# Patient Record
Sex: Male | Born: 1980 | Race: Black or African American | Hispanic: No | Marital: Single | Smoking: Former smoker
Health system: Southern US, Community
[De-identification: ages and names within clinical notes are randomized; demographics above are authoritative.]

---

## 2004-05-25 ENCOUNTER — Emergency Department (HOSPITAL_COMMUNITY): Admission: EM | Admit: 2004-05-25 | Discharge: 2004-05-25 | Payer: Self-pay | Admitting: Emergency Medicine

## 2004-05-25 ENCOUNTER — Ambulatory Visit (HOSPITAL_COMMUNITY): Admission: RE | Admit: 2004-05-25 | Discharge: 2004-05-25 | Payer: Self-pay | Admitting: Emergency Medicine

## 2008-09-11 HISTORY — PX: SHOULDER SURGERY: SHX246

## 2012-01-24 ENCOUNTER — Emergency Department (HOSPITAL_COMMUNITY)
Admission: EM | Admit: 2012-01-24 | Discharge: 2012-01-24 | Disposition: A | Discharge status: Home / Self Care | Payer: Self-pay | Source: Home / Self Care

## 2015-06-17 ENCOUNTER — Ambulatory Visit (INDEPENDENT_AMBULATORY_CARE_PROVIDER_SITE_OTHER): Payer: BLUE CROSS/BLUE SHIELD | Admitting: Family Medicine

## 2015-06-17 VITALS — BP 128/80 | HR 56 | Temp 98.4°F | Resp 16 | Ht 69.25 in | Wt 149.0 lb

## 2015-06-17 DIAGNOSIS — Z0000 Encounter for general adult medical examination without abnormal findings: Secondary | ICD-10-CM | POA: Diagnosis not present

## 2015-06-17 NOTE — Progress Notes (Signed)
[facility] [address], [place] [address] [phone]  Date:  06/17/2015   Name:  Jose Brennan   DOB:  1981/05/21   MRN:  [id number]  PCP:  No primary care provider on file.    Chief Complaint: Annual Exam   History of Present Illness:  Jose Brennan is a 34 y.o. 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   History reviewed. No pertinent past medical history.  Past Surgical History  Procedure Laterality Date  . Shoulder surgery Left 2010    Social History  Substance Use Topics  . Smoking status: Former Smoker    Quit date: 09/11/2006  . Smokeless tobacco: None  . Alcohol Use: 1.8 oz/week    3 Standard drinks or equivalent per week    Family History  Problem Relation Age of Onset  . Diabetes Maternal Grandmother     No Known Allergies  Medication list has been reviewed and updated.  No current outpatient prescriptions on file prior to visit.   No current facility-administered medications on file prior to visit.    Review of Systems:  As per HPI- otherwise negative.   Physical Examination: Filed Vitals:   06/17/15 1049  BP: 128/80  Pulse: 56  Temp: 98.4 F (36.9 C)  Resp: 16   Filed Vitals:   06/17/15 1049  Height: 5' 9.25" (1.759 m)  Weight: 149 lb (67.586 kg)   Body mass index is 21.84 kg/(m^2). Ideal Body Weight: Weight in (lb) to have BMI = 25: 170.2  GEN: WDWN, NAD, Non-toxic, A & O x 3, looks well HEENT: Atraumatic, Normocephalic. Neck supple. No masses, No  LAD. Ears and Nose: No external deformity. CV: RRR, No M/G/R. No JVD. No thrill. No extra heart sounds. PULM: CTA B, no wheezes, crackles, rhonchi. No retractions. No resp. distress. No accessory muscle use. ABD: S, NT, ND, +BS. No rebound. No HSM. EXTR: No c/c/e NEURO Normal gait.   Normal strength and ROM of all extremities PSYCH: Normally interactive. Conversant. Not depressed or anxious appearing.  Calm demeanor.  Normal GU exam  Assessment and Plan: Physical exam  Cleared for agility test Declines other services today  Signed Abbe Amsterdam, MD

## 2015-06-17 NOTE — Patient Instructions (Signed)
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

## 2015-08-25 ENCOUNTER — Other Ambulatory Visit: Payer: Self-pay | Admitting: Occupational Medicine

## 2015-08-25 ENCOUNTER — Ambulatory Visit
Admission: RE | Admit: 2015-08-25 | Discharge: 2015-08-25 | Disposition: A | Discharge status: Home / Self Care | Payer: No Typology Code available for payment source | Source: Ambulatory Visit | Attending: Occupational Medicine | Admitting: Occupational Medicine

## 2015-08-25 DIAGNOSIS — Z021 Encounter for pre-employment examination: Secondary | ICD-10-CM

## 2019-03-29 ENCOUNTER — Other Ambulatory Visit: Payer: Self-pay

## 2019-03-29 ENCOUNTER — Emergency Department (HOSPITAL_BASED_OUTPATIENT_CLINIC_OR_DEPARTMENT_OTHER)
Admission: EM | Admit: 2019-03-29 | Discharge: 2019-03-29 | Disposition: A | Discharge status: Home / Self Care | Payer: No Typology Code available for payment source | Attending: Emergency Medicine | Admitting: Emergency Medicine

## 2019-03-29 ENCOUNTER — Emergency Department (HOSPITAL_BASED_OUTPATIENT_CLINIC_OR_DEPARTMENT_OTHER): Payer: No Typology Code available for payment source

## 2019-03-29 ENCOUNTER — Encounter (HOSPITAL_BASED_OUTPATIENT_CLINIC_OR_DEPARTMENT_OTHER): Payer: Self-pay | Admitting: Adult Health

## 2019-03-29 DIAGNOSIS — Y929 Unspecified place or not applicable: Secondary | ICD-10-CM | POA: Insufficient documentation

## 2019-03-29 DIAGNOSIS — Z87891 Personal history of nicotine dependence: Secondary | ICD-10-CM | POA: Insufficient documentation

## 2019-03-29 DIAGNOSIS — Y9389 Activity, other specified: Secondary | ICD-10-CM | POA: Diagnosis not present

## 2019-03-29 DIAGNOSIS — Y999 Unspecified external cause status: Secondary | ICD-10-CM | POA: Diagnosis not present

## 2019-03-29 DIAGNOSIS — M79601 Pain in right arm: Secondary | ICD-10-CM | POA: Diagnosis present

## 2019-03-29 DIAGNOSIS — M5412 Radiculopathy, cervical region: Secondary | ICD-10-CM

## 2019-03-29 MED ORDER — IBUPROFEN 800 MG PO TABS
800.0000 mg | ORAL_TABLET | Freq: Once | ORAL | Status: AC
  Administered 2019-03-29: 800 mg via ORAL
  Filled 2019-03-29: qty 1

## 2019-03-29 MED ORDER — CYCLOBENZAPRINE HCL 10 MG PO TABS
10.0000 mg | ORAL_TABLET | Freq: Two times a day (BID) | ORAL | 0 refills | Status: AC | PRN
Start: 2019-03-29 — End: ?

## 2019-03-29 MED ORDER — PREDNISONE 10 MG PO TABS: 50.0000 mg | ORAL_TABLET | Freq: Every day | ORAL | 0 refills | Status: AC

## 2019-03-29 NOTE — ED Notes (Signed)
ED Provider at bedside. 

## 2019-03-29 NOTE — ED Triage Notes (Signed)
Present with a sharp tingling down right arm from the back of the shoulder blade to his thumb that began after he grabbed a hammer and was falling back wards, it pulled the right arm and shoulder. HE is here with [employer]

## 2019-03-29 NOTE — ED Provider Notes (Signed)
[facility] EMERGENCY DEPARTMENT Provider Note   CSN: [id number] Arrival date & time: 03/29/19  [id number]    History   Chief Complaint Chief Complaint  Patient presents with  . Arm Injury    HPI Italyhad Dutter is a 38 y.o. male.     38 year old male, right-hand-dominant, presents with complaint of sharp shooting pain in his right arm after a fall today.  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 Patient reports sharp shooting pain going to his right thumb at times, reproducible with turning his head to the right (right ear to right shoulder). Patient has not taken anything for pain prior to arrival. No other injuries or concerns.      History reviewed. No pertinent past medical history.  There are no active problems to display for this patient.   Past Surgical History:  Procedure Laterality Date  . SHOULDER SURGERY Left 2010        Home Medications    Prior to Admission medications   Medication Sig Start Date End Date Taking? Authorizing Provider  cyclobenzaprine (FLEXERIL) 10 MG tablet Take 1 tablet (10 mg total) by mouth 2 (two) times daily as needed for muscle spasms. 03/29/19   Jeannie FendMurphy, Laura A, PA-C  predniSONE (DELTASONE) 10 MG tablet Take 5 tablets (50 mg total) by mouth daily for 5 days. 03/29/19 04/03/19  Jeannie FendMurphy, Laura A, PA-C    Family History Family History  Problem Relation Age of Onset  . Diabetes Maternal Grandmother     Social History Social History   Tobacco Use  . Smoking status: Former Smoker    Quit date: 09/11/2006    Years since quitting: 12.5  Substance Use Topics  . Alcohol use: Yes    Alcohol/week: 3.0 standard drinks    Types: 3 Standard drinks or equivalent per week  . Drug use: No     Allergies   Patient has no known allergies.   Review of Systems Review of Systems  Constitutional: Negative for fever.   Musculoskeletal: Positive for myalgias. Negative for arthralgias, back pain, gait problem, joint swelling, neck pain and neck stiffness.  Skin: Negative for rash and wound.  Allergic/Immunologic: Negative for immunocompromised state.  Neurological: Negative for weakness and numbness.  Psychiatric/Behavioral: Negative for confusion.  All other systems reviewed and are negative.    Physical Exam Updated Vital Signs BP (!) 169/80   Pulse 67   Temp 98.3 F (36.8 C) (Oral)   Resp 18   Ht 5\' 9"  (1.753 m)   Wt 79.4 kg   SpO2 98%   BMI 25.84 kg/m   Physical Exam Vitals signs and nursing note reviewed.  Constitutional:      General: He is not in acute distress.    Appearance: He is well-developed. He is not diaphoretic.  HENT:     Head: Normocephalic and atraumatic.  Neck:     Musculoskeletal: Normal range of motion and neck supple. No muscular tenderness.  Cardiovascular:     Pulses: Normal pulses.  Pulmonary:     Effort: Pulmonary effort is normal.  Musculoskeletal: Normal range of motion.        General: Tenderness present. No swelling or deformity.     Right shoulder: He exhibits normal range of motion, no tenderness, no bony tenderness, no swelling, no effusion, no crepitus, normal pulse and normal strength.  Cervical back: He exhibits normal range of motion, no tenderness and no bony tenderness.     Thoracic back: He exhibits tenderness. He exhibits normal range of motion and no bony tenderness.       Back:     Comments: TTP right trapezius area without palpable spasm.  Skin:    General: Skin is warm and dry.  Neurological:     Mental Status: He is alert and oriented to person, place, and time.     Sensory: No sensory deficit.  Psychiatric:        Behavior: Behavior normal.      ED Treatments / Results  Labs (all labs ordered are listed, but only abnormal results are displayed) Labs Reviewed - No data to display  EKG None  Radiology Dg Cervical Spine  Complete  Result Date: 03/29/2019 CLINICAL DATA:  Radicular pain right arm. EXAM: CERVICAL SPINE - COMPLETE 4+ VIEW COMPARISON:  None. FINDINGS: Cervical spine alignment is maintained. Vertebral body heights and intervertebral disc spaces are preserved. The dens is intact. Posterior elements appear well-aligned. There is no evidence of fracture. Small right cervical rib. No prevertebral soft tissue edema. IMPRESSION: 1. No acute osseous abnormality of the cervical spine. 2. Incidental small right cervical rib. Electronically Signed   By: Narda RutherfordMelanie  Sanford M.D.   On: 03/29/2019 20:34    Procedures Procedures (including critical care time)  Medications Ordered in ED Medications  ibuprofen (ADVIL) tablet 800 mg (800 mg Oral Given 03/29/19 1959)     Initial Impression / Assessment and Plan / ED Course  I have reviewed the triage vital signs and the nursing notes.  Pertinent labs & imaging results that were available during my care of the patient were reviewed by me and considered in my medical decision making (see chart for details).  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  On exam patient is tenderness in the right trapezius area, pain is worse with right ear to shoulder movement of the neck.  There is no pain with range of motion of the right shoulder, no bony tenderness in the neck or shoulder.  X-ray of the C-spine is unremarkable with the exception of an incidental finding of a small right cervical rib.  Patient was given copy of his x-ray, discussed results.  Patient feels at this time like he is able to go back to work.  Patient will be discharged on prednisone, given prescription for Flexeril and advised not to take the Flexeril  if he is at work.  Plan is for patient to follow-up with employee health on Monday.   [LM]    Clinical Course User Index [LM] Jeannie FendMurphy, Laura A, PA-C       Final Clinical Impressions(s) / ED Diagnoses   Final diagnoses:  Cervical radiculopathy    ED Discharge Orders         Ordered    predniSONE (DELTASONE) 10 MG tablet  Daily     03/29/19 2054    cyclobenzaprine (FLEXERIL) 10 MG tablet  2 times daily PRN     03/29/19 2054           Jeannie FendMurphy, Laura A, PA-C 03/29/19 2056  Gareth Morgan, MD 03/30/19 1526

## 2019-03-29 NOTE — Discharge Instructions (Addendum)
Follow-up with employee health on Monday for recheck. You may return to work today if you feel safe and able to perform your job duties.  If you are unable to perform your job duties safely, you should go home and follow-up on Monday as planned with employee health.  Take prednisone as prescribed and complete the full course.  Take Flexeril as needed as prescribed for muscle spasm or soreness, do not drive or operate machinery if you are taking Flexeril.

## 2019-12-04 ENCOUNTER — Ambulatory Visit: Payer: 59 | Attending: Internal Medicine

## 2019-12-04 DIAGNOSIS — Z23 Encounter for immunization: Secondary | ICD-10-CM

## 2019-12-04 NOTE — Progress Notes (Signed)
   Covid-19 Vaccination Clinic  Name:  Jose Brennan    MRN: [id number] DOB: [birth year]  12/04/2019  Jose Brennan was observed post Covid-19 immunization for 15 minutes without incident. He was provided with Vaccine Information Sheet and instruction to access the V-Safe system.   Jose Brennan was instructed to call 911 with any severe reactions post vaccine: Marland Kitchen Difficulty breathing  . Swelling of face and throat  . A fast heartbeat  . A bad rash all over body  . Dizziness and weakness   Immunizations Administered    Name Date Dose VIS Date Route   Pfizer COVID-19 Vaccine 12/04/2019 10:58 AM 0.3 mL 08/22/2019 Intramuscular   Manufacturer: [employer], [employer]   Lot: [id number]   NDC: [address]-8

## 2019-12-29 ENCOUNTER — Ambulatory Visit: Payer: 59 | Attending: Internal Medicine

## 2019-12-29 DIAGNOSIS — Z23 Encounter for immunization: Secondary | ICD-10-CM

## 2019-12-29 NOTE — Progress Notes (Signed)
   Covid-19 Vaccination Clinic  Name:  Jose Brennan    MRN: [id number] DOB: [birth year]  12/29/2019  Jose Brennan was observed post Covid-19 immunization for 15 minutes without incident. He was provided with Vaccine Information Sheet and instruction to access the V-Safe system.   Jose Brennan was instructed to call 911 with any severe reactions post vaccine: Marland Kitchen Difficulty breathing  . Swelling of face and throat  . A fast heartbeat  . A bad rash all over body  . Dizziness and weakness   Immunizations Administered    Name Date Dose VIS Date Route   Pfizer COVID-19 Vaccine 12/29/2019 10:20 AM 0.3 mL 11/05/2018 Intramuscular   Manufacturer: [employer], [employer]   Lot: [id number]   NDC: [address]-2

## 2020-01-07 ENCOUNTER — Other Ambulatory Visit: Payer: Self-pay | Admitting: Nurse Practitioner

## 2020-01-07 ENCOUNTER — Ambulatory Visit
Admission: RE | Admit: 2020-01-07 | Discharge: 2020-01-07 | Disposition: A | Discharge status: Home / Self Care | Payer: No Typology Code available for payment source | Source: Ambulatory Visit | Attending: Nurse Practitioner | Admitting: Nurse Practitioner

## 2020-01-07 ENCOUNTER — Other Ambulatory Visit: Payer: Self-pay

## 2020-01-07 DIAGNOSIS — R52 Pain, unspecified: Secondary | ICD-10-CM

## 2020-07-25 IMAGING — CR DG CERVICAL SPINE COMPLETE 4+V
5 series · 5 of 5 positions shown · non-contrast
Comparison: Cervical spine x-rays dated March 29, 2019.

CLINICAL DATA: Right-sided neck pain with right hand numbness for
the past 2-3 months.

EXAM:
CERVICAL SPINE - COMPLETE 4+ VIEW

[w cervical spine lat]
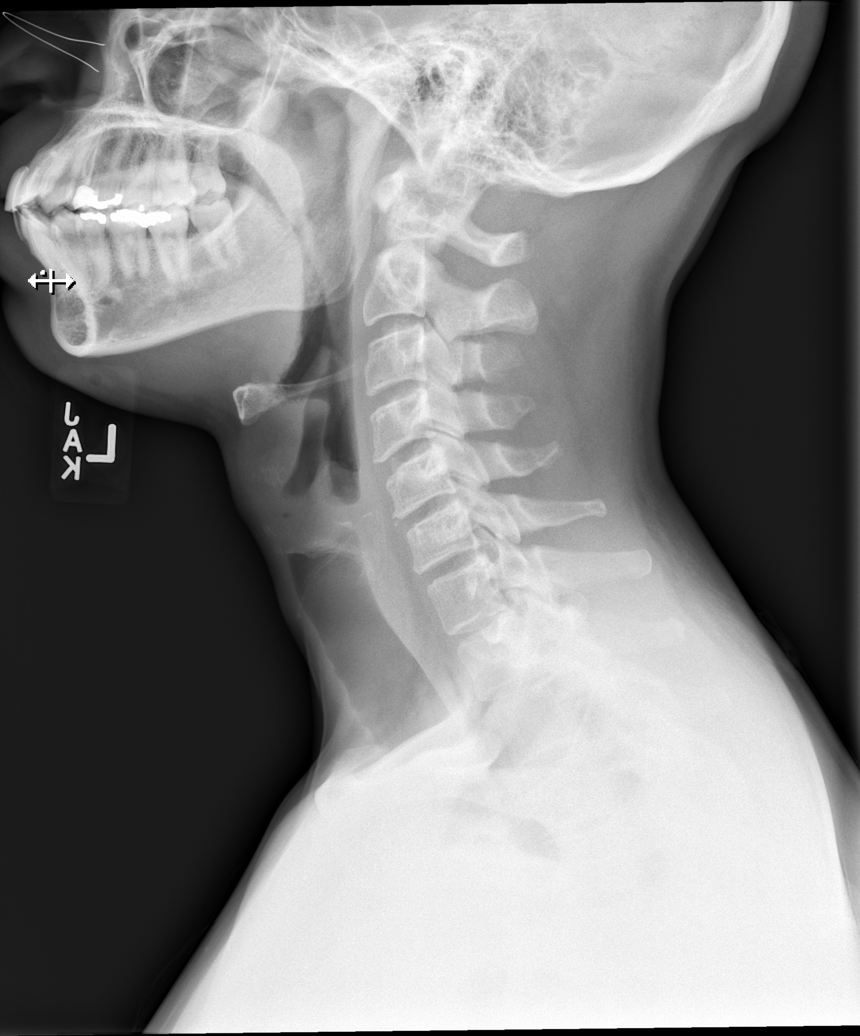

[w cervical spine ap_obl (1 of 2)]
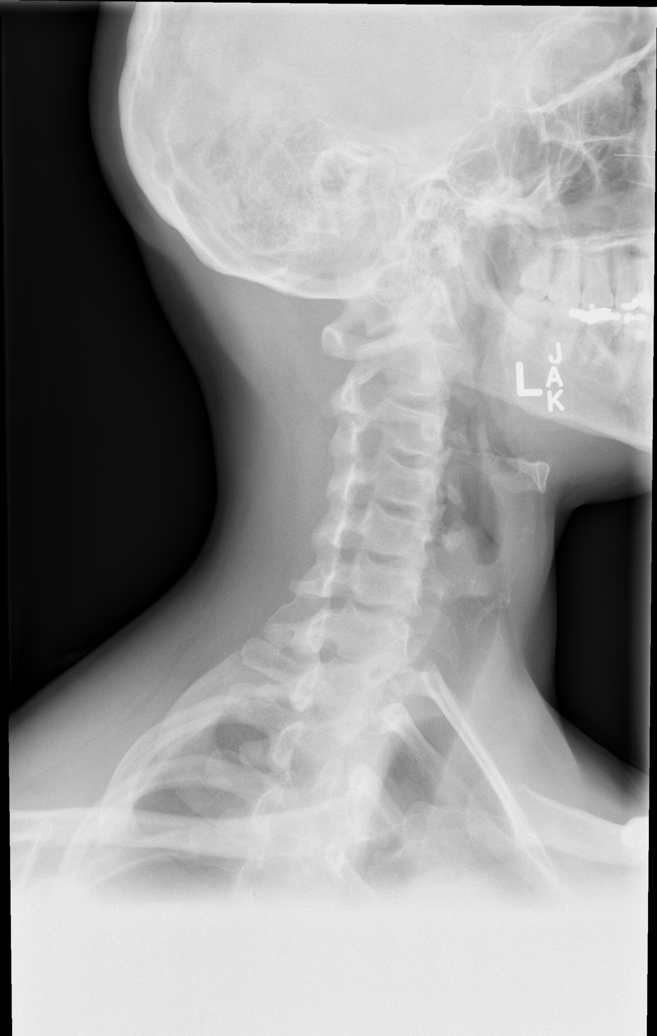

[w cervical spine ap_obl (2 of 2)]
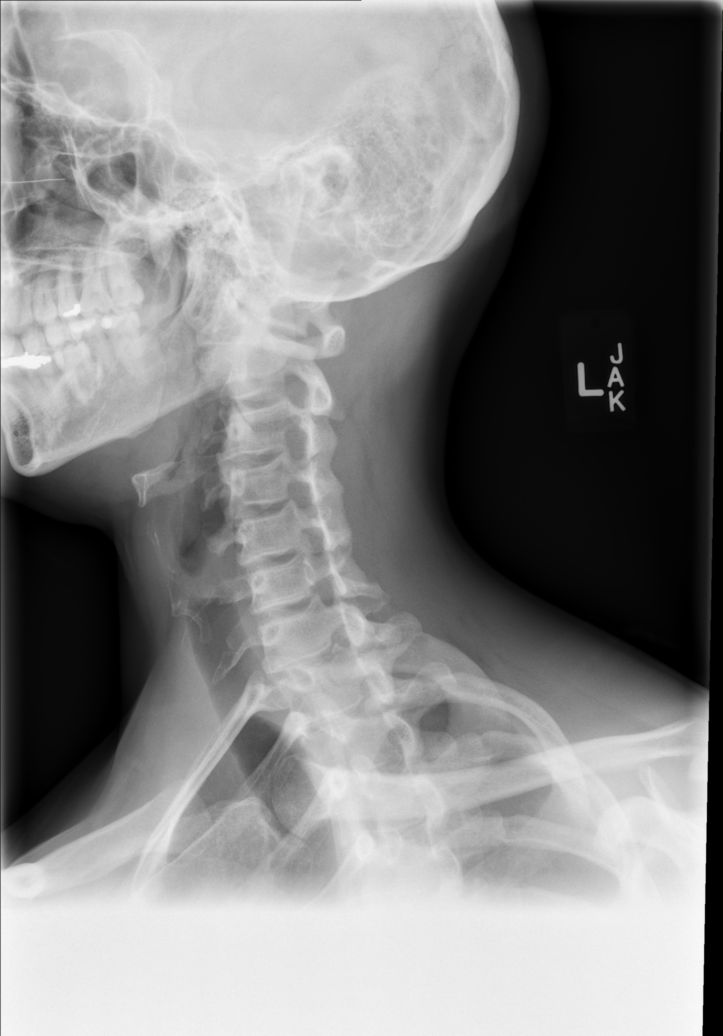

[w cervical spine ap (1 of 2)]
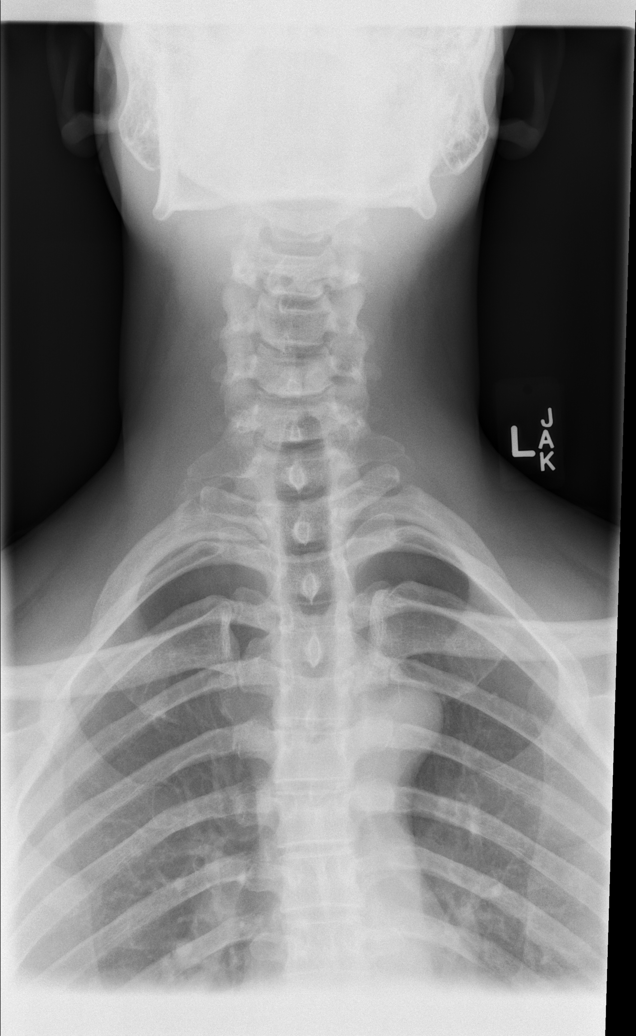

[w cervical spine ap (2 of 2)]
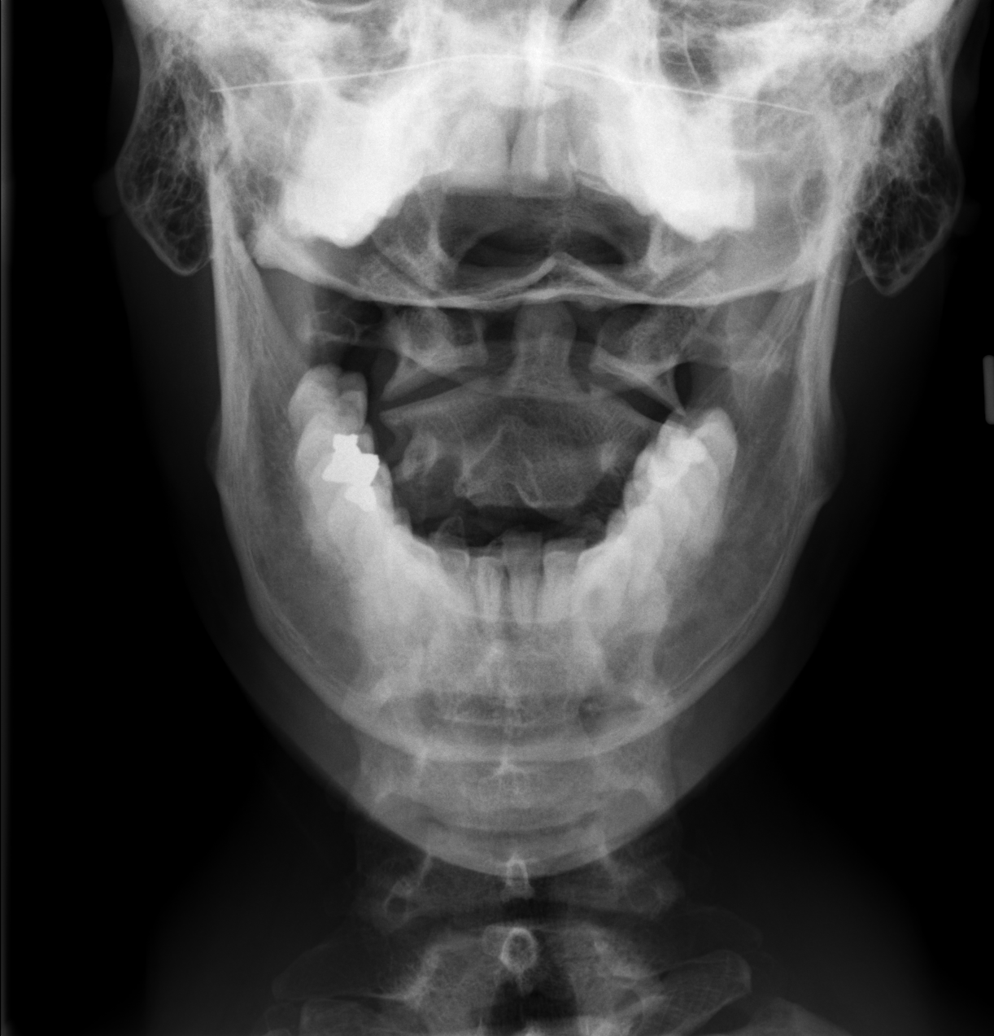

[5 of 5 positions shown; findings below may reference images not displayed]

FINDINGS: The lateral view is diagnostic to the T1 level. There is no acute
fracture or subluxation. Vertebral body heights are preserved.

Alignment is normal. New mild disc height loss and uncovertebral
hypertrophy at C5-C6. Remaining intervertebral disc spaces are
maintained. Small right cervical rib again noted.

Normal prevertebral soft tissues.
IMPRESSION: 1. New mild degenerative disc disease at C5-C6.

## 2021-07-07 ENCOUNTER — Other Ambulatory Visit: Payer: Self-pay

## 2021-07-07 ENCOUNTER — Encounter (HOSPITAL_BASED_OUTPATIENT_CLINIC_OR_DEPARTMENT_OTHER): Payer: Self-pay | Admitting: Emergency Medicine

## 2021-07-07 ENCOUNTER — Emergency Department (HOSPITAL_BASED_OUTPATIENT_CLINIC_OR_DEPARTMENT_OTHER): Payer: 59

## 2021-07-07 ENCOUNTER — Emergency Department (HOSPITAL_BASED_OUTPATIENT_CLINIC_OR_DEPARTMENT_OTHER): Payer: 59 | Admitting: Radiology

## 2021-07-07 DIAGNOSIS — I4891 Unspecified atrial fibrillation: Secondary | ICD-10-CM | POA: Diagnosis not present

## 2021-07-07 DIAGNOSIS — R42 Dizziness and giddiness: Secondary | ICD-10-CM | POA: Diagnosis not present

## 2021-07-07 DIAGNOSIS — Z87891 Personal history of nicotine dependence: Secondary | ICD-10-CM | POA: Diagnosis not present

## 2021-07-07 DIAGNOSIS — Z79899 Other long term (current) drug therapy: Secondary | ICD-10-CM | POA: Diagnosis not present

## 2021-07-07 LAB — COMPREHENSIVE METABOLIC PANEL
ALT: 15 U/L (ref 0–44)
AST: 18 U/L (ref 15–41)
Albumin: 4.5 g/dL (ref 3.5–5.0)
Alkaline Phosphatase: 72 U/L (ref 38–126)
Anion gap: 10 (ref 5–15)
BUN: 12 mg/dL (ref 6–20)
CO2: 25 mmol/L (ref 22–32)
Calcium: 9.5 mg/dL (ref 8.9–10.3)
Chloride: 102 mmol/L (ref 98–111)
Creatinine, Ser: 1.04 mg/dL (ref 0.61–1.24)
GFR, Estimated: >60 mL/min (ref >60)
Glucose, Bld: 88 mg/dL (ref 70–99)
Potassium: 3.6 mmol/L (ref 3.5–5.1)
Sodium: 137 mmol/L (ref 135–145)
Total Bilirubin: 0.5 mg/dL (ref 0.3–1.2)
Total Protein: 7.1 g/dL (ref 6.5–8.1)

## 2021-07-07 LAB — RAPID URINE DRUG SCREEN, HOSP PERFORMED
Amphetamines: NOT DETECTED
Barbiturates: NOT DETECTED
Benzodiazepines: NOT DETECTED
Cocaine: NOT DETECTED
Opiates: NOT DETECTED
Tetrahydrocannabinol: NOT DETECTED

## 2021-07-07 LAB — ETHANOL: Alcohol, Ethyl (B): <10 mg/dL (ref <10)

## 2021-07-07 LAB — URINALYSIS, ROUTINE W REFLEX MICROSCOPIC
Bilirubin Urine: NEGATIVE
Glucose, UA: NEGATIVE mg/dL
Hgb urine dipstick: NEGATIVE
Ketones, ur: NEGATIVE mg/dL
Leukocytes,Ua: NEGATIVE
Nitrite: NEGATIVE
Protein, ur: NEGATIVE mg/dL
Specific Gravity, Urine: 1.025 (ref 1.005–1.030)
pH: 6.5 (ref 5.0–8.0)

## 2021-07-07 LAB — CBC WITH DIFFERENTIAL/PLATELET
Abs Immature Granulocytes: 0.02 10*3/uL (ref 0.00–0.07)
Basophils Absolute: 0 10*3/uL (ref 0.0–0.1)
Basophils Relative: 0 %
Eosinophils Absolute: 0.2 10*3/uL (ref 0.0–0.5)
Eosinophils Relative: 2 %
HCT: 41.9 % (ref 39.0–52.0)
Hemoglobin: 14.5 g/dL (ref 13.0–17.0)
Immature Granulocytes: 0 %
Lymphocytes Relative: 31 %
Lymphs Abs: 2.3 10*3/uL (ref 0.7–4.0)
MCH: 29.8 pg (ref 26.0–34.0)
MCHC: 34.6 g/dL (ref 30.0–36.0)
MCV: 86 fL (ref 80.0–100.0)
Monocytes Absolute: 0.7 10*3/uL (ref 0.1–1.0)
Monocytes Relative: 10 %
Neutro Abs: 4.1 10*3/uL (ref 1.7–7.7)
Neutrophils Relative %: 57 %
Platelets: 201 10*3/uL (ref 150–400)
RBC: 4.87 MIL/uL (ref 4.22–5.81)
RDW: 12.2 % (ref 11.5–15.5)
WBC: 7.3 10*3/uL (ref 4.0–10.5)
nRBC: 0 % (ref 0.0–0.2)

## 2021-07-07 LAB — TROPONIN I (HIGH SENSITIVITY): Troponin I (High Sensitivity): <2 ng/L (ref <18)

## 2021-07-07 LAB — PHOSPHORUS: Phosphorus: 3.1 mg/dL (ref 2.5–4.6)

## 2021-07-07 LAB — MAGNESIUM: Magnesium: 2 mg/dL (ref 1.7–2.4)

## 2021-07-07 LAB — PROTIME-INR
INR: 1 (ref 0.8–1.2)
Prothrombin Time: 13.2 seconds (ref 11.4–15.2)

## 2021-07-07 NOTE — ED Triage Notes (Signed)
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

## 2021-07-07 NOTE — ED Provider Notes (Signed)
[facility] EMERGENCY DEPT Provider Note   CSN: [id number] Arrival date & time: 07/07/21  2022     History Chief Complaint  Patient presents with   Dizziness    Jose Brennan is a 40 y.o. male.  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  He did not have any symptoms at that time.  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  Felt like if he turned his head quickly things took some time to catch up.  He also had the perception of being lightheaded.  No associated headache.  No visual changes.  No chest pain no palpitations.  No focal weakness numbness or tingling of extremities.  No tinnitus.  With his symptoms he did get evaluation by EMS.  A EKG was done and vital signs assessed.  One of the EKGs had irregular beats and concern for possible atrial fibrillation.  Patient was advised to come to the emergency department for further evaluation.  Patient reports at this time he has no perception of dizziness or active symptoms.    History reviewed. No pertinent past medical history.  There are no problems to display for this patient.   Past Surgical History:  Procedure Laterality Date   SHOULDER SURGERY Left 2010       Family History  Problem Relation Age of Onset   Diabetes Maternal Grandmother     Social History   Tobacco Use   Smoking status: Former    Types: Cigarettes    Quit date: 09/11/2006    Years since quitting: 14.8    Passive exposure: Never  Substance Use Topics   Alcohol use: Yes    Alcohol/week: 3.0 standard drinks    Types: 3 Standard drinks or equivalent per week   Drug use: No    Home Medications Prior to Admission medications   Medication Sig Start Date End Date Taking? Authorizing Provider  cyclobenzaprine (FLEXERIL) 10 MG tablet Take 1 tablet (10 mg total) by mouth 2 (two) times daily as needed for muscle spasms. 03/29/19    Jeannie Fend, PA-C    Allergies    Prednisone  Review of Systems   Review of Systems 10 systems reviewed negative except as per HPI Physical Exam Updated Vital Signs BP (!) 159/104 (BP Location: Left Arm)   Pulse 61   Temp 98.4 F (36.9 C)   Resp 20   SpO2 100%   Physical Exam Constitutional:      Comments: Alert nontoxic clinically well in appearance.  Nourished well-developed.  HENT:     Head: Normocephalic and atraumatic.     Right Ear: Tympanic membrane normal.     Left Ear: Tympanic membrane normal.     Mouth/Throat:     Mouth: Mucous membranes are moist.     Pharynx: Oropharynx is clear.  Eyes:     Extraocular Movements: Extraocular movements intact.     Pupils: Pupils are equal, round, and reactive to light.  Cardiovascular:     Rate and Rhythm: Normal rate and regular rhythm.  Pulmonary:     Effort: Pulmonary effort is normal.     Breath sounds: Normal breath sounds.  Abdominal:     General: There is no distension.     Palpations: Abdomen is soft.     Tenderness: There is no abdominal tenderness.  Musculoskeletal:  General: No swelling. Normal range of motion.     Cervical back: Neck supple.  Skin:    General: Skin is warm and dry.  Neurological:     General: No focal deficit present.     Mental Status: He is oriented to person, place, and time.     Cranial Nerves: No cranial nerve deficit.     Motor: No weakness.     Coordination: Coordination normal.  Psychiatric:        Mood and Affect: Mood normal.    ED Results / Procedures / Treatments   Labs (all labs ordered are listed, but only abnormal results are displayed) Labs Reviewed - No data to display  EKG EKG Interpretation  Date/Time:  Friday July 08 2021 01:32:29 EDT Ventricular Rate:  48 PR Interval:  116 QRS Duration: 98 QT Interval:  462 QTC Calculation: 412 R Axis:   75 Text Interpretation: Sinus bradycardia with Premature atrial complexes Minimal voltage criteria for  LVH, may be normal variant ( Sokolow-Lyon ) Early repolarization Borderline ECG Confirmed by Linwood Dibbles [phone]) on 07/09/2021 9:00:00 PM  Radiology No results found.  Procedures Procedures   Medications Ordered in ED Medications - No data to display  ED Course  I have reviewed the triage vital signs and the nursing notes.  Pertinent labs & imaging results that were available during my care of the patient were reviewed by me and considered in my medical decision making (see chart for details).    MDM Rules/Calculators/A&P                           Patient describes symptoms of both vertiginous quality and lightheadedness.  Patient does not have any vertigo at this time.  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  Review does show PACs and a possible U wave.  We will proceed with electrolytes, TSH and other labs.  Will obtain CT head as well.  Dr. Mother to follow-up on diagnostic results for final disposition.  Patient is stable at time of evaluation, if diagnostic work-up normal anticipate discharge. Final Clinical Impression(s) / ED Diagnoses Final diagnoses:  Light headed  PAC (premature atrial contraction)    Rx / DC Orders ED Discharge Orders     None        Arby Barrette, MD 07/14/21 [phone]

## 2021-07-08 ENCOUNTER — Emergency Department (HOSPITAL_BASED_OUTPATIENT_CLINIC_OR_DEPARTMENT_OTHER)
Admission: EM | Admit: 2021-07-08 | Discharge: 2021-07-08 | Disposition: A | Discharge status: Home / Self Care | Payer: 59 | Attending: Emergency Medicine | Admitting: Emergency Medicine

## 2021-07-08 DIAGNOSIS — I491 Atrial premature depolarization: Secondary | ICD-10-CM

## 2021-07-08 DIAGNOSIS — R42 Dizziness and giddiness: Secondary | ICD-10-CM

## 2021-07-08 LAB — TSH: TSH: 2.539 u[IU]/mL (ref 0.350–4.500)

## 2021-07-08 NOTE — ED Notes (Signed)
IV removed, per pt placed by EMS pta; site clean, dry, intact

## 2021-07-08 NOTE — ED Provider Notes (Signed)
1:39 AM Assumed care from Dr. Donnald Garre, please see their note for full history, physical and decision making until this point. In brief this is a 40 y.o. year old male who presented to the ED tonight with Dizziness     Symptoms resolved quite a while ago.  Did have PACs on his EKG and its likely what he felt when he felt the palpitations.  Ambulates without difficulty.  His work-up is negative.  Neurologic exam is intact.  Patient stable for discharge.  We will follow-up with PCP/cardiology if he continues to feel palpitations or PACs.    Discharge instructions, including strict return precautions for new or worsening symptoms, given. Patient and/or family verbalized understanding and agreement with the plan as described.   Labs, studies and imaging reviewed by myself and considered in medical decision making if ordered. Imaging interpreted by radiology.  Labs Reviewed  COMPREHENSIVE METABOLIC PANEL  ETHANOL  CBC WITH DIFFERENTIAL/PLATELET  PROTIME-INR  MAGNESIUM  PHOSPHORUS  TSH  RAPID URINE DRUG SCREEN, HOSP PERFORMED  URINALYSIS, ROUTINE W REFLEX MICROSCOPIC  TROPONIN I (HIGH SENSITIVITY)  TROPONIN I (HIGH SENSITIVITY)    DG Chest 2 View  Final Result    CT Head Wo Contrast  Final Result      No follow-ups on file.    Jose Brennan, Barbara Cower, MD 07/08/21 [phone]

## 2024-06-30 ENCOUNTER — Other Ambulatory Visit (HOSPITAL_BASED_OUTPATIENT_CLINIC_OR_DEPARTMENT_OTHER): Payer: Self-pay | Admitting: Family Medicine

## 2024-06-30 DIAGNOSIS — Z8249 Family history of ischemic heart disease and other diseases of the circulatory system: Secondary | ICD-10-CM

## 2024-09-02 ENCOUNTER — Ambulatory Visit (HOSPITAL_BASED_OUTPATIENT_CLINIC_OR_DEPARTMENT_OTHER)
Admission: RE | Admit: 2024-09-02 | Discharge: 2024-09-02 | Disposition: A | Discharge status: Home / Self Care | Payer: Self-pay | Source: Ambulatory Visit | Attending: Family Medicine | Admitting: Family Medicine

## 2024-09-02 DIAGNOSIS — Z8249 Family history of ischemic heart disease and other diseases of the circulatory system: Secondary | ICD-10-CM
